# Patient Record
Sex: Male | Born: 1980 | Race: White | Hispanic: No | Marital: Married
Health system: Southern US, Community
[De-identification: ages and names within clinical notes are randomized; demographics above are authoritative.]

---

## 2014-05-07 ENCOUNTER — Other Ambulatory Visit: Payer: Self-pay | Admitting: Neurosurgery

## 2014-05-23 ENCOUNTER — Encounter (HOSPITAL_COMMUNITY): Payer: Self-pay

## 2014-05-23 ENCOUNTER — Encounter (HOSPITAL_COMMUNITY)
Admission: RE | Admit: 2014-05-23 | Discharge: 2014-05-23 | Disposition: A | Discharge status: Home / Self Care | Payer: BC Managed Care – PPO | Source: Ambulatory Visit | Attending: Neurosurgery | Admitting: Neurosurgery

## 2014-05-23 DIAGNOSIS — Z01812 Encounter for preprocedural laboratory examination: Secondary | ICD-10-CM | POA: Diagnosis not present

## 2014-05-23 DIAGNOSIS — I10 Essential (primary) hypertension: Secondary | ICD-10-CM | POA: Diagnosis not present

## 2014-05-23 DIAGNOSIS — Z01810 Encounter for preprocedural cardiovascular examination: Secondary | ICD-10-CM | POA: Insufficient documentation

## 2014-05-23 LAB — BASIC METABOLIC PANEL
Anion gap: 11 (ref 5–15)
BUN: 14 mg/dL (ref 6–20)
CO2: 24 mmol/L (ref 22–32)
CREATININE: 0.89 mg/dL (ref 0.61–1.24)
Calcium: 9.3 mg/dL (ref 8.9–10.3)
Chloride: 104 mmol/L (ref 101–111)
GFR calc Af Amer: >60 mL/min (ref >60)
GLUCOSE: 125 mg/dL — AB (ref 70–99)
Potassium: 4 mmol/L (ref 3.5–5.1)
Sodium: 139 mmol/L (ref 135–145)

## 2014-05-23 LAB — CBC
HCT: 45.1 % (ref 39.0–52.0)
HEMOGLOBIN: 15.6 g/dL (ref 13.0–17.0)
MCH: 29.3 pg (ref 26.0–34.0)
MCHC: 34.6 g/dL (ref 30.0–36.0)
MCV: 84.6 fL (ref 78.0–100.0)
Platelets: 235 10*3/uL (ref 150–400)
RBC: 5.33 MIL/uL (ref 4.22–5.81)
RDW: 12.8 % (ref 11.5–15.5)
WBC: 7.3 10*3/uL (ref 4.0–10.5)

## 2014-05-23 LAB — TYPE AND SCREEN
ABO/RH(D): A POS
Antibody Screen: NEGATIVE

## 2014-05-23 LAB — SURGICAL PCR SCREEN
MRSA, PCR: POSITIVE — AB
Staphylococcus aureus: POSITIVE — AB

## 2014-05-23 LAB — ABO/RH: ABO/RH(D): A POS

## 2014-05-23 NOTE — Pre-Procedure Instructions (Signed)
Catha BrowScott R Abernathy  05/23/2014   Your procedure is scheduled on:  May 31, 2014  [facility]mitting at 8 AM.  Call this number if you have problems the morning of surgery: [phone]   Remember:   Do not eat food or drink liquids after midnight.   Take these medicines the morning of surgery with A SIP OF WATER: gabapentin (NEURONTIN), ranitidine (ZANTAC)   IF NEEDED: HYDROcodone-acetaminophen (NORCO/VICODIN), tiZANidine (ZANAFLEX)   STOP ASPIRIN, NSAIDS (ADVIL, ALEVE, IBUPROFEN) ONE WEEK PRIOR TO SURGERY   Do not wear jewelry.  Do not wear lotions, powders, or colognes. You may wear deodorant.  Men may shave face and neck.  Do not bring valuables to the hospital.  [facility] is not responsible  for any belongings or valuables.               Contacts, dentures or bridgework may not be worn into surgery.  Leave suitcase in the car. After surgery it may be brought to your room.  For patients admitted to the hospital, discharge time is determined by your   treatment team.               Patients discharged the day of surgery will not be allowed to drive  home.  Name and phone number of your driver: family/friend  Special Instructions: "Preaparing for surgery"   Please read over the following fact sheets that you were given: Pain Booklet, Coughing and Deep Breathing, Blood Transfusion Information and Surgical Site Infection Prevention

## 2014-05-23 NOTE — Progress Notes (Signed)
Left voice message at [phone] for pt to pick up mupirocin

## 2014-05-30 MED ORDER — DEXTROSE 5 % IV SOLN
3.0000 g | INTRAVENOUS | Status: AC
  Administered 2014-05-31: 3 g via INTRAVENOUS
  Filled 2014-05-30: qty 3000

## 2014-05-31 ENCOUNTER — Encounter (HOSPITAL_COMMUNITY): Payer: Self-pay

## 2014-05-31 ENCOUNTER — Inpatient Hospital Stay (HOSPITAL_COMMUNITY): Payer: BC Managed Care – PPO | Admitting: Anesthesiology

## 2014-05-31 ENCOUNTER — Inpatient Hospital Stay (HOSPITAL_COMMUNITY): Payer: BC Managed Care – PPO

## 2014-05-31 ENCOUNTER — Inpatient Hospital Stay (HOSPITAL_COMMUNITY)
Admission: RE | Admit: 2014-05-31 | Discharge: 2014-06-01 | DRG: 460 | Disposition: A | Discharge status: Home / Self Care | Payer: BC Managed Care – PPO | Source: Ambulatory Visit | Attending: Neurosurgery | Admitting: Neurosurgery

## 2014-05-31 ENCOUNTER — Encounter (HOSPITAL_COMMUNITY)
Admission: RE | Disposition: A | Discharge status: Home / Self Care | Payer: Self-pay | Source: Ambulatory Visit | Attending: Neurosurgery

## 2014-05-31 DIAGNOSIS — M4806 Spinal stenosis, lumbar region: Secondary | ICD-10-CM | POA: Diagnosis present

## 2014-05-31 DIAGNOSIS — E669 Obesity, unspecified: Secondary | ICD-10-CM | POA: Diagnosis present

## 2014-05-31 DIAGNOSIS — Z419 Encounter for procedure for purposes other than remedying health state, unspecified: Secondary | ICD-10-CM

## 2014-05-31 DIAGNOSIS — R7611 Nonspecific reaction to tuberculin skin test without active tuberculosis: Secondary | ICD-10-CM | POA: Diagnosis present

## 2014-05-31 DIAGNOSIS — Z6839 Body mass index (BMI) 39.0-39.9, adult: Secondary | ICD-10-CM | POA: Diagnosis not present

## 2014-05-31 DIAGNOSIS — Z79891 Long term (current) use of opiate analgesic: Secondary | ICD-10-CM | POA: Diagnosis not present

## 2014-05-31 DIAGNOSIS — M5116 Intervertebral disc disorders with radiculopathy, lumbar region: Secondary | ICD-10-CM | POA: Diagnosis present

## 2014-05-31 DIAGNOSIS — M4316 Spondylolisthesis, lumbar region: Secondary | ICD-10-CM | POA: Diagnosis present

## 2014-05-31 DIAGNOSIS — Z79899 Other long term (current) drug therapy: Secondary | ICD-10-CM

## 2014-05-31 DIAGNOSIS — M4126 Other idiopathic scoliosis, lumbar region: Secondary | ICD-10-CM | POA: Diagnosis present

## 2014-05-31 HISTORY — PX: MAXIMUM ACCESS (MAS)POSTERIOR LUMBAR INTERBODY FUSION (PLIF) 1 LEVEL: SHX6368

## 2014-05-31 SURGERY — FOR MAXIMUM ACCESS (MAS) POSTERIOR LUMBAR INTERBODY FUSION (PLIF) 1 LEVEL
Anesthesia: General | Site: Spine Lumbar

## 2014-05-31 MED ORDER — OXYCODONE HCL 5 MG PO TABS
5.0000 mg | ORAL_TABLET | Freq: Once | ORAL | Status: AC | PRN
  Administered 2014-05-31: 5 mg via ORAL

## 2014-05-31 MED ORDER — METHOCARBAMOL 500 MG PO TABS
500.0000 mg | ORAL_TABLET | Freq: Four times a day (QID) | ORAL | Status: DC | PRN
  Administered 2014-05-31 – 2014-06-01 (×3): 500 mg via ORAL
  Filled 2014-05-31 (×3): qty 1

## 2014-05-31 MED ORDER — LIDOCAINE-EPINEPHRINE 1 %-1:100000 IJ SOLN
INTRAMUSCULAR | Status: DC | PRN
  Administered 2014-05-31: 5 mL

## 2014-05-31 MED ORDER — ARTIFICIAL TEARS OP OINT
TOPICAL_OINTMENT | OPHTHALMIC | Status: DC | PRN
  Administered 2014-05-31: 1 via OPHTHALMIC

## 2014-05-31 MED ORDER — PROPOFOL 10 MG/ML IV BOLUS
INTRAVENOUS | Status: DC | PRN
  Administered 2014-05-31: 250 mg via INTRAVENOUS

## 2014-05-31 MED ORDER — LIDOCAINE HCL (CARDIAC) 20 MG/ML IV SOLN
INTRAVENOUS | Status: DC | PRN
  Administered 2014-05-31: 100 mg via INTRAVENOUS

## 2014-05-31 MED ORDER — PROMETHAZINE HCL 25 MG/ML IJ SOLN
INTRAMUSCULAR | Status: AC
  Filled 2014-05-31: qty 1

## 2014-05-31 MED ORDER — ALUM & MAG HYDROXIDE-SIMETH 200-200-20 MG/5ML PO SUSP: 30.0000 mL | Freq: Four times a day (QID) | ORAL | Status: DC | PRN

## 2014-05-31 MED ORDER — KETOROLAC TROMETHAMINE 30 MG/ML IJ SOLN
INTRAMUSCULAR | Status: AC
  Filled 2014-05-31: qty 1

## 2014-05-31 MED ORDER — GLYCOPYRROLATE 0.2 MG/ML IJ SOLN
INTRAMUSCULAR | Status: AC
  Filled 2014-05-31: qty 5

## 2014-05-31 MED ORDER — DOCUSATE SODIUM 100 MG PO CAPS
100.0000 mg | ORAL_CAPSULE | Freq: Two times a day (BID) | ORAL | Status: DC
  Administered 2014-05-31: 100 mg via ORAL
  Filled 2014-05-31: qty 1

## 2014-05-31 MED ORDER — DEXTROSE 5 % IV SOLN
500.0000 mg | Freq: Four times a day (QID) | INTRAVENOUS | Status: DC | PRN
  Filled 2014-05-31: qty 5

## 2014-05-31 MED ORDER — NEOSTIGMINE METHYLSULFATE 10 MG/10ML IV SOLN
INTRAVENOUS | Status: DC | PRN
  Administered 2014-05-31: 5 mg via INTRAVENOUS

## 2014-05-31 MED ORDER — FAMOTIDINE 10 MG PO TABS
10.0000 mg | ORAL_TABLET | Freq: Every day | ORAL | Status: DC
  Filled 2014-05-31: qty 1

## 2014-05-31 MED ORDER — OXYCODONE HCL 5 MG/5ML PO SOLN: 5.0000 mg | Freq: Once | ORAL | Status: AC | PRN

## 2014-05-31 MED ORDER — ACETAMINOPHEN 325 MG PO TABS: 650.0000 mg | ORAL_TABLET | ORAL | Status: DC | PRN

## 2014-05-31 MED ORDER — PROPOFOL INFUSION 10 MG/ML OPTIME
INTRAVENOUS | Status: DC | PRN
  Administered 2014-05-31: 50 ug/kg/min via INTRAVENOUS

## 2014-05-31 MED ORDER — ZOLPIDEM TARTRATE 5 MG PO TABS: 5.0000 mg | ORAL_TABLET | Freq: Every evening | ORAL | Status: DC | PRN

## 2014-05-31 MED ORDER — ONDANSETRON HCL 4 MG/2ML IJ SOLN
INTRAMUSCULAR | Status: DC | PRN
  Administered 2014-05-31: 4 mg via INTRAVENOUS

## 2014-05-31 MED ORDER — OXYCODONE-ACETAMINOPHEN 5-325 MG PO TABS
1.0000 | ORAL_TABLET | ORAL | Status: DC | PRN
  Administered 2014-05-31: 2 via ORAL
  Administered 2014-05-31: 1 via ORAL
  Administered 2014-06-01 (×2): 2 via ORAL
  Filled 2014-05-31 (×4): qty 2

## 2014-05-31 MED ORDER — HYDROMORPHONE HCL 1 MG/ML IJ SOLN
0.2500 mg | INTRAMUSCULAR | Status: DC | PRN
  Administered 2014-05-31 (×2): 0.5 mg via INTRAVENOUS

## 2014-05-31 MED ORDER — PANTOPRAZOLE SODIUM 40 MG PO TBEC
40.0000 mg | DELAYED_RELEASE_TABLET | Freq: Every day | ORAL | Status: DC
  Administered 2014-05-31: 40 mg via ORAL
  Filled 2014-05-31: qty 1

## 2014-05-31 MED ORDER — ROCURONIUM BROMIDE 100 MG/10ML IV SOLN
INTRAVENOUS | Status: DC | PRN
  Administered 2014-05-31: 50 mg via INTRAVENOUS

## 2014-05-31 MED ORDER — MENTHOL 3 MG MT LOZG: 1.0000 | LOZENGE | OROMUCOSAL | Status: DC | PRN

## 2014-05-31 MED ORDER — HYDROMORPHONE HCL 1 MG/ML IJ SOLN: 0.5000 mg | INTRAMUSCULAR | Status: DC | PRN

## 2014-05-31 MED ORDER — LIDOCAINE HCL (CARDIAC) 20 MG/ML IV SOLN
INTRAVENOUS | Status: AC
Start: 2014-05-31 — End: 2014-05-31
  Filled 2014-05-31: qty 30

## 2014-05-31 MED ORDER — BUPIVACAINE LIPOSOME 1.3 % IJ SUSP
INTRAMUSCULAR | Status: DC | PRN
  Administered 2014-05-31: 20 mL

## 2014-05-31 MED ORDER — PHENYLEPHRINE HCL 10 MG PO TABS: 10.0000 mg | ORAL_TABLET | ORAL | Status: DC | PRN

## 2014-05-31 MED ORDER — ACETAMINOPHEN 650 MG RE SUPP: 650.0000 mg | RECTAL | Status: DC | PRN

## 2014-05-31 MED ORDER — FENTANYL CITRATE (PF) 250 MCG/5ML IJ SOLN
INTRAMUSCULAR | Status: AC
  Filled 2014-05-31: qty 5

## 2014-05-31 MED ORDER — GABAPENTIN 300 MG PO CAPS
300.0000 mg | ORAL_CAPSULE | Freq: Three times a day (TID) | ORAL | Status: DC | PRN
  Filled 2014-05-31: qty 1

## 2014-05-31 MED ORDER — FLEET ENEMA 7-19 GM/118ML RE ENEM
1.0000 | ENEMA | Freq: Once | RECTAL | Status: AC | PRN
  Filled 2014-05-31: qty 1

## 2014-05-31 MED ORDER — NEOSTIGMINE METHYLSULFATE 10 MG/10ML IV SOLN
INTRAVENOUS | Status: AC
  Filled 2014-05-31: qty 1

## 2014-05-31 MED ORDER — HYDROCODONE-ACETAMINOPHEN 5-325 MG PO TABS: 1.0000 | ORAL_TABLET | ORAL | Status: DC | PRN

## 2014-05-31 MED ORDER — CEFAZOLIN SODIUM-DEXTROSE 2-3 GM-% IV SOLR
2.0000 g | Freq: Three times a day (TID) | INTRAVENOUS | Status: AC
  Administered 2014-05-31 – 2014-06-01 (×2): 2 g via INTRAVENOUS
  Filled 2014-05-31 (×2): qty 50

## 2014-05-31 MED ORDER — MIDAZOLAM HCL 2 MG/2ML IJ SOLN
INTRAMUSCULAR | Status: AC
  Filled 2014-05-31: qty 2

## 2014-05-31 MED ORDER — THROMBIN 20000 UNITS EX SOLR
CUTANEOUS | Status: DC | PRN
  Administered 2014-05-31: 13:00:00 via TOPICAL

## 2014-05-31 MED ORDER — PROPOFOL 10 MG/ML IV BOLUS
INTRAVENOUS | Status: AC
  Filled 2014-05-31: qty 20

## 2014-05-31 MED ORDER — PROMETHAZINE HCL 25 MG/ML IJ SOLN
6.2500 mg | INTRAMUSCULAR | Status: DC | PRN
  Administered 2014-05-31: 6.25 mg via INTRAVENOUS

## 2014-05-31 MED ORDER — PANTOPRAZOLE SODIUM 40 MG IV SOLR
40.0000 mg | Freq: Every day | INTRAVENOUS | Status: DC
  Filled 2014-05-31: qty 40

## 2014-05-31 MED ORDER — BUPIVACAINE HCL (PF) 0.5 % IJ SOLN
INTRAMUSCULAR | Status: DC | PRN
  Administered 2014-05-31: 5 mL

## 2014-05-31 MED ORDER — KETOROLAC TROMETHAMINE 30 MG/ML IJ SOLN
30.0000 mg | Freq: Once | INTRAMUSCULAR | Status: AC | PRN
  Administered 2014-05-31: 30 mg via INTRAVENOUS

## 2014-05-31 MED ORDER — SUCCINYLCHOLINE CHLORIDE 20 MG/ML IJ SOLN
INTRAMUSCULAR | Status: DC | PRN
  Administered 2014-05-31: 100 mg via INTRAVENOUS

## 2014-05-31 MED ORDER — OXYCODONE HCL 5 MG PO TABS
ORAL_TABLET | ORAL | Status: AC
  Filled 2014-05-31: qty 1

## 2014-05-31 MED ORDER — LACTATED RINGERS IV SOLN
INTRAVENOUS | Status: DC
  Administered 2014-05-31 (×3): via INTRAVENOUS

## 2014-05-31 MED ORDER — ONDANSETRON HCL 4 MG/2ML IJ SOLN
INTRAMUSCULAR | Status: AC
  Filled 2014-05-31: qty 4

## 2014-05-31 MED ORDER — SUCCINYLCHOLINE CHLORIDE 20 MG/ML IJ SOLN
INTRAMUSCULAR | Status: AC
Start: 2014-05-31 — End: 2014-05-31
  Filled 2014-05-31: qty 2

## 2014-05-31 MED ORDER — FENTANYL CITRATE (PF) 100 MCG/2ML IJ SOLN
INTRAMUSCULAR | Status: DC | PRN
  Administered 2014-05-31 (×2): 50 ug via INTRAVENOUS
  Administered 2014-05-31: 200 ug via INTRAVENOUS
  Administered 2014-05-31 (×2): 50 ug via INTRAVENOUS
  Administered 2014-05-31: 100 ug via INTRAVENOUS

## 2014-05-31 MED ORDER — VANCOMYCIN HCL 10 G IV SOLR
1500.0000 mg | Freq: Once | INTRAVENOUS | Status: AC
  Administered 2014-05-31: 1500 mg via INTRAVENOUS
  Filled 2014-05-31: qty 1500

## 2014-05-31 MED ORDER — PHENOL 1.4 % MT LIQD: 1.0000 | OROMUCOSAL | Status: DC | PRN

## 2014-05-31 MED ORDER — MUPIROCIN 2 % EX OINT: 1.0000 "application " | TOPICAL_OINTMENT | Freq: Two times a day (BID) | CUTANEOUS | Status: DC

## 2014-05-31 MED ORDER — CHLORHEXIDINE GLUCONATE CLOTH 2 % EX PADS
6.0000 | MEDICATED_PAD | Freq: Every day | CUTANEOUS | Status: DC
  Administered 2014-06-01: 6 via TOPICAL

## 2014-05-31 MED ORDER — SODIUM CHLORIDE 0.9 % IV SOLN: 250.0000 mL | INTRAVENOUS | Status: DC

## 2014-05-31 MED ORDER — TIZANIDINE HCL 4 MG PO TABS
4.0000 mg | ORAL_TABLET | Freq: Three times a day (TID) | ORAL | Status: DC | PRN
  Filled 2014-05-31: qty 1

## 2014-05-31 MED ORDER — SODIUM CHLORIDE 0.9 % IJ SOLN: 3.0000 mL | INTRAMUSCULAR | Status: DC | PRN

## 2014-05-31 MED ORDER — 0.9 % SODIUM CHLORIDE (POUR BTL) OPTIME
TOPICAL | Status: DC | PRN
  Administered 2014-05-31: 1000 mL

## 2014-05-31 MED ORDER — SODIUM CHLORIDE 0.9 % IJ SOLN: 3.0000 mL | Freq: Two times a day (BID) | INTRAMUSCULAR | Status: DC

## 2014-05-31 MED ORDER — BISACODYL 10 MG RE SUPP: 10.0000 mg | Freq: Every day | RECTAL | Status: DC | PRN

## 2014-05-31 MED ORDER — BUPIVACAINE LIPOSOME 1.3 % IJ SUSP
20.0000 mL | INTRAMUSCULAR | Status: DC
  Filled 2014-05-31: qty 20

## 2014-05-31 MED ORDER — POLYETHYLENE GLYCOL 3350 17 G PO PACK
17.0000 g | PACK | Freq: Every day | ORAL | Status: DC | PRN
  Filled 2014-05-31: qty 1

## 2014-05-31 MED ORDER — HYDROMORPHONE HCL 1 MG/ML IJ SOLN
INTRAMUSCULAR | Status: AC
  Filled 2014-05-31: qty 1

## 2014-05-31 MED ORDER — KCL IN DEXTROSE-NACL 20-5-0.45 MEQ/L-%-% IV SOLN
INTRAVENOUS | Status: DC
  Filled 2014-05-31 (×3): qty 1000

## 2014-05-31 MED ORDER — GLYCOPYRROLATE 0.2 MG/ML IJ SOLN
INTRAMUSCULAR | Status: DC | PRN
  Administered 2014-05-31: 1 mg via INTRAVENOUS

## 2014-05-31 MED ORDER — ONDANSETRON HCL 4 MG/2ML IJ SOLN: 4.0000 mg | INTRAMUSCULAR | Status: DC | PRN

## 2014-05-31 MED ORDER — GLYCOPYRROLATE 0.2 MG/ML IJ SOLN
INTRAMUSCULAR | Status: AC
  Filled 2014-05-31: qty 4

## 2014-05-31 SURGICAL SUPPLY — 83 items
BENZOIN TINCTURE PRP APPL 2/3 (GAUZE/BANDAGES/DRESSINGS) ×3 IMPLANT
BIT DRILL PLIF MAS DISP 5.5MM (DRILL) ×1 IMPLANT
BLADE CLIPPER SURG (BLADE) IMPLANT
BONE MATRIX OSTEOCEL PRO MED (Bone Implant) ×3 IMPLANT
BUR MATCHSTICK NEURO 3.0 LAGG (BURR) ×3 IMPLANT
BUR ROUND FLUTED 5 RND (BURR) ×2 IMPLANT
BUR ROUND FLUTED 5MM RND (BURR) ×1
CAGE COROENT LG 12X9X23-12 (Cage) ×6 IMPLANT
CANISTER SUCT 3000ML PPV (MISCELLANEOUS) ×3 IMPLANT
CLIP NEUROVISION LG (CLIP) ×3 IMPLANT
CLOSURE WOUND 1/2 X4 (GAUZE/BANDAGES/DRESSINGS) ×1
CONT SPEC 4OZ CLIKSEAL STRL BL (MISCELLANEOUS) ×6 IMPLANT
COVER BACK TABLE 24X17X13 BIG (DRAPES) IMPLANT
COVER BACK TABLE 60X90IN (DRAPES) ×3 IMPLANT
DECANTER SPIKE VIAL GLASS SM (MISCELLANEOUS) ×3 IMPLANT
DERMABOND ADHESIVE PROPEN (GAUZE/BANDAGES/DRESSINGS) ×2
DERMABOND ADVANCED (GAUZE/BANDAGES/DRESSINGS) ×2
DERMABOND ADVANCED .7 DNX12 (GAUZE/BANDAGES/DRESSINGS) ×1 IMPLANT
DERMABOND ADVANCED .7 DNX6 (GAUZE/BANDAGES/DRESSINGS) ×1 IMPLANT
DRAPE C-ARM 42X72 X-RAY (DRAPES) ×6 IMPLANT
DRAPE C-ARMOR (DRAPES) ×3 IMPLANT
DRAPE LAPAROTOMY 100X72X124 (DRAPES) ×3 IMPLANT
DRAPE POUCH INSTRU U-SHP 10X18 (DRAPES) ×3 IMPLANT
DRAPE SURG 17X23 STRL (DRAPES) ×3 IMPLANT
DRILL PLIF MAS DISP 5.5MM (DRILL) ×3
DRSG OPSITE POSTOP 4X6 (GAUZE/BANDAGES/DRESSINGS) ×3 IMPLANT
DRSG TELFA 3X8 NADH (GAUZE/BANDAGES/DRESSINGS) IMPLANT
DURAPREP 26ML APPLICATOR (WOUND CARE) ×3 IMPLANT
ELECT REM PT RETURN 9FT ADLT (ELECTROSURGICAL) ×3
ELECTRODE REM PT RTRN 9FT ADLT (ELECTROSURGICAL) ×1 IMPLANT
EVACUATOR 1/8 PVC DRAIN (DRAIN) ×3 IMPLANT
GAUZE SPONGE 4X4 12PLY STRL (GAUZE/BANDAGES/DRESSINGS) IMPLANT
GAUZE SPONGE 4X4 16PLY XRAY LF (GAUZE/BANDAGES/DRESSINGS) IMPLANT
GLOVE BIO SURGEON STRL SZ7 (GLOVE) ×9 IMPLANT
GLOVE BIO SURGEON STRL SZ8 (GLOVE) ×6 IMPLANT
GLOVE BIOGEL PI IND STRL 7.0 (GLOVE) ×3 IMPLANT
GLOVE BIOGEL PI IND STRL 8 (GLOVE) ×2 IMPLANT
GLOVE BIOGEL PI IND STRL 8.5 (GLOVE) ×2 IMPLANT
GLOVE BIOGEL PI INDICATOR 7.0 (GLOVE) ×6
GLOVE BIOGEL PI INDICATOR 8 (GLOVE) ×4
GLOVE BIOGEL PI INDICATOR 8.5 (GLOVE) ×4
GLOVE ECLIPSE 8.0 STRL XLNG CF (GLOVE) ×6 IMPLANT
GOWN STRL REUS W/ TWL LRG LVL3 (GOWN DISPOSABLE) IMPLANT
GOWN STRL REUS W/ TWL XL LVL3 (GOWN DISPOSABLE) ×5 IMPLANT
GOWN STRL REUS W/TWL 2XL LVL3 (GOWN DISPOSABLE) ×6 IMPLANT
GOWN STRL REUS W/TWL LRG LVL3 (GOWN DISPOSABLE)
GOWN STRL REUS W/TWL XL LVL3 (GOWN DISPOSABLE) ×10
KIT BASIN OR (CUSTOM PROCEDURE TRAY) ×3 IMPLANT
KIT NEEDLE NVM5 EMG ELECT (KITS) ×1 IMPLANT
KIT NEEDLE NVM5 EMG ELECTRODE (KITS) ×2
KIT POSITION SURG JACKSON T1 (MISCELLANEOUS) ×3 IMPLANT
KIT ROOM TURNOVER OR (KITS) ×3 IMPLANT
MILL MEDIUM DISP (BLADE) ×3 IMPLANT
NEEDLE HYPO 21X1.5 SAFETY (NEEDLE) ×3 IMPLANT
NEEDLE HYPO 25X1 1.5 SAFETY (NEEDLE) ×3 IMPLANT
NEEDLE SPNL 18GX3.5 QUINCKE PK (NEEDLE) IMPLANT
NS IRRIG 1000ML POUR BTL (IV SOLUTION) ×3 IMPLANT
PACK LAMINECTOMY NEURO (CUSTOM PROCEDURE TRAY) ×3 IMPLANT
PAD ARMBOARD 7.5X6 YLW CONV (MISCELLANEOUS) ×9 IMPLANT
PATTIES SURGICAL .5 X.5 (GAUZE/BANDAGES/DRESSINGS) IMPLANT
PATTIES SURGICAL .5 X1 (DISPOSABLE) IMPLANT
PATTIES SURGICAL 1X1 (DISPOSABLE) IMPLANT
ROD PREBENT 45MM LUMBAR (Rod) ×6 IMPLANT
SCREW LOCK (Screw) ×8 IMPLANT
SCREW LOCK FXNS SPNE MAS PL (Screw) ×4 IMPLANT
SCREW PLIF MAS 5.5X35 LUMBAR (Screw) ×6 IMPLANT
SCREW SHANKS 5.5X35 (Screw) ×6 IMPLANT
SCREW TULIP 5.5 (Screw) ×6 IMPLANT
SPONGE LAP 4X18 X RAY DECT (DISPOSABLE) IMPLANT
SPONGE SURGIFOAM ABS GEL 100 (HEMOSTASIS) ×3 IMPLANT
STAPLER SKIN PROX WIDE 3.9 (STAPLE) IMPLANT
STRIP CLOSURE SKIN 1/2X4 (GAUZE/BANDAGES/DRESSINGS) ×2 IMPLANT
SUT VIC AB 1 CT1 18XBRD ANBCTR (SUTURE) ×2 IMPLANT
SUT VIC AB 1 CT1 8-18 (SUTURE) ×4
SUT VIC AB 2-0 CT1 18 (SUTURE) ×6 IMPLANT
SUT VIC AB 3-0 SH 8-18 (SUTURE) ×6 IMPLANT
SYR 20ML ECCENTRIC (SYRINGE) ×3 IMPLANT
SYR 5ML LL (SYRINGE) IMPLANT
TOWEL OR 17X24 6PK STRL BLUE (TOWEL DISPOSABLE) ×3 IMPLANT
TOWEL OR 17X26 10 PK STRL BLUE (TOWEL DISPOSABLE) ×3 IMPLANT
TRAP SPECIMEN MUCOUS 40CC (MISCELLANEOUS) ×3 IMPLANT
TRAY FOLEY CATH 14FRSI W/METER (CATHETERS) ×3 IMPLANT
WATER STERILE IRR 1000ML POUR (IV SOLUTION) ×3 IMPLANT

## 2014-05-31 NOTE — Transfer of Care (Signed)
Immediate Anesthesia Transfer of Care Note  Patient: Hector Hodge  Procedure(s) Performed: Procedure(s) with comments: Lumbar Four-Five Maximum access posterior lumbar interbody fusion (N/A) - L4-5 Maximum access posterior lumbar interbody fusion  Patient Location: PACU  Anesthesia Type:General  Level of Consciousness: awake, alert , oriented and patient cooperative  Airway & Oxygen Therapy: Patient Spontanous Breathing and Patient connected to face mask oxygen  Post-op Assessment: Report given to RN, Post -op Vital signs reviewed and stable and Patient moving all extremities  Post vital signs: Reviewed and stable  Last Vitals:  Filed Vitals:   05/31/14 0926  BP: 146/83  Pulse: 100  Temp: 36.2 C  Resp: 20    Complications: No apparent anesthesia complications

## 2014-05-31 NOTE — Evaluation (Signed)
Physical Therapy Evaluation Patient Details Name: Hector BrowScott R Venus MRN: [id number] DOB: 1980/09/13 Today's Date: 05/31/2014   History of Present Illness  pt is a 34 y/o male admitted with LE pain, spondylolisthesis, s/p L45 fusion  Clinical Impression  Pt admitted with/for lumbar fusion surgery.  Pt currently limited functionally due to the problems listed below.  (see problems list.)  Pt will benefit from PT to maximize function and safety to be able to get home safely with available assist of family.     Follow Up Recommendations No PT follow up    Equipment Recommendations  None recommended by PT    Recommendations for Other Services       Precautions / Restrictions Precautions Precautions: Back Required Braces or Orthoses: Spinal Brace Spinal Brace: Lumbar corset;Applied in sitting position      Mobility  Bed Mobility               General bed mobility comments: discussed log roll and coming up via sidelying  Transfers Overall transfer level: Needs assistance   Transfers: Sit to/from Stand Sit to Stand: Supervision            Ambulation/Gait Ambulation/Gait assistance: Supervision Ambulation Distance (Feet): 250 Feet Assistive device: None Gait Pattern/deviations: Step-through pattern   Gait velocity interpretation: at or above normal speed for age/gender General Gait Details: steady and safe  Stairs Stairs: Yes   Stair Management: One rail Right;Alternating pattern;Forwards Number of Stairs: 10 General stair comments: safe with rail  Wheelchair Mobility    Modified Rankin (Stroke Patients Only)       Balance Overall balance assessment: No apparent balance deficits (not formally assessed)                                           Pertinent Vitals/Pain Pain Assessment: Faces Faces Pain Scale: Hurts a little bit Pain Intervention(s): Monitored during session    Home Living Family/patient expects to be discharged  to:: Private residence Living Arrangements: Spouse/significant other Available Help at Discharge: Family (as needed for 1 week) Type of Home: House Home Access: Stairs to enter   [employer] Corporation[employer]trance Stairs-Number of Steps: 1 Home Layout: One level Home Equipment: Walker - 2 wheels      Prior Function Level of Independence: Independent               Hand Dominance        Extremity/Trunk Assessment               Lower Extremity Assessment: Overall WFL for tasks assessed         Communication   Communication: No difficulties  Cognition Arousal/Alertness: Awake/alert Behavior During Therapy: WFL for tasks assessed/performed Overall Cognitive Status: Within Functional Limits for tasks assessed                      General Comments General comments (skin integrity, edema, etc.): Instructed in back care/prec, log roll, lifting restrictions, progression of activity.    Exercises        Assessment/Plan    PT Assessment Patient needs continued PT services  PT Diagnosis     PT Problem List Decreased activity tolerance;Decreased mobility;Decreased knowledge of precautions  PT Treatment Interventions DME instruction;Gait training;Functional mobility training;Therapeutic activities;Patient/family education   PT Goals (Current goals can be found in the Care Plan section) Acute Rehab PT Goals Patient Stated  Goal: independence PT Goal Formulation: With patient Time For Goal Achievement: 06/07/14 Potential to Achieve Goals: Good    Frequency Min 5X/week   Barriers to discharge        Co-evaluation               End of Session Equipment Utilized During Treatment: Back brace Activity Tolerance: Patient tolerated treatment well Patient left: in chair;with call bell/phone within reach;with family/visitor present Nurse Communication: Mobility status         Time: [id number] PT Time Calculation (min) (ACUTE ONLY): 27 min   Charges:   PT  Evaluation $Initial PT Evaluation Tier I: 1 Procedure PT Treatments $Gait Training: 8-22 mins   PT G Codes:        Corday Wyka, Eliseo GumKenneth V 05/31/2014, 6:34 PM 05/31/2014  Freeland BingKen Blue Ruggerio, PT [phone]  (pager)

## 2014-05-31 NOTE — Interval H&P Note (Signed)
History and Physical Interval Note:  05/31/2014 7:11 AM  Hector BrowScott R Hodge  has presented today for surgery, with the diagnosis of Spondylolisthesis, Lumbar region; Disc displacement, Lumbar; Scoliosis, Idiopathic; Spinal stenosis of the lumbar region  The various methods of treatment have been discussed with the patient and family. After consideration of risks, benefits and other options for treatment, the patient has consented to  Procedure(s) with comments: L4-5 Maximum access posterior lumbar interbody fusion (N/A) - L4-5 Maximum access posterior lumbar interbody fusion as a surgical intervention .  The patient's history has been reviewed, patient examined, no change in status, stable for surgery.  I have reviewed the patient's chart and labs.  Questions were answered to the patient's satisfaction.     [name]

## 2014-05-31 NOTE — Brief Op Note (Signed)
05/31/2014  3:08 PM  PATIENT:  Hector Hodge  34 y.o. male  PRE-OPERATIVE DIAGNOSIS:  Spondylolisthesis, Lumbar region; Disc displacement, Lumbar; Scoliosis, Idiopathic; Spinal stenosis of the lumbar region L 45 level  POST-OPERATIVE DIAGNOSIS:  Spondylolisthesis, Lumbar region; Disc displacement, Lumbar; Scoliosis, Idiopathic; Spinal stenosis of the lumbar region L 45 level  PROCEDURE:  Procedure(s) with comments: Lumbar Four-Five Maximum access posterior lumbar interbody fusion (N/A) - L4-5 Maximum access posterior lumbar interbody fusion with PEEK cages, pedicle screw fixation, posterolateral arthrodesis  SURGEON:  Surgeon(s) and Role:    * Maeola HarmanJoseph Valena Ivanov, MD - Primary  PHYSICIAN ASSISTANT:   ASSISTANTS: Poteat, RN   ANESTHESIA:   general  EBL:  Total I/O In: 1200 [I.V.:1200] Out: 400 [Urine:100; Blood:300]  BLOOD ADMINISTERED:none  DRAINS: none   LOCAL MEDICATIONS USED:  MARCAINE    and LIDOCAINE   SPECIMEN:  No Specimen  DISPOSITION OF SPECIMEN:  N/A  COUNTS:  YES  TOURNIQUET:  * No tourniquets in log *  DICTATION: Patient is a 34 year old with spondylosis , stenosis, spondylolisthesis, disc herniation and severe back and right greater than left lower extremity pain at L4/5 levels of the lumbar spine. It was elected to take him to surgery for MASPLIF L 45 level with posterolateral arthrodesis.  Procedure:   Following uncomplicated induction of GETA, and placement of electrodes for neural monitoring, patient was turned into a prone position on the [place]ak[place] tableand using AP  fluoroscopy the area of planned incision was marked, prepped with betadine scrub and Duraprep, then draped. Exposure was performed of facet joint complex at L 45 level and the MAS retractor was placed.5.5 x 35 mm cortical Nuvasive screws were placed at L 4 bilaterally according to standard landmarks using neural monitoring.  A total laminectomy of L 4 was then performed with disarticulation of  facets.  This bone was saved for grafting, combined with Osteocel after being run through bone mill and was placed in bone packing device.  Thorough discectomy was performed bilaterally at L 45 and the endplates were prepared for grafting.  23 x 12 x 12 degree cages were placed in the interspace and positioning was confirmed with AP and lateral fluoroscopy.  10 cc of autograft/Osteocel was packed in the interspace medial to the second cage.   Remaining screws were placed at L 5 and 35 mm rods were placed.   And the screws were locked and torqued.Final Xrays showed well positioned implants and screw fixation. The posterolateral region was packed with remaining 10 cc of autograft on the right of midline. The wounds were irrigated and then closed with 1, 2-0 and 3-0 Vicryl stitches. Sterile occlusive dressing was placed with Dermabond and an occlusive dressing. The patient was then extubated in the operating room and taken to recovery in stable and satisfactory condition having tolerated her operation well. Counts were correct at the end of the case.  PLAN OF CARE: Admit to inpatient   PATIENT DISPOSITION:  PACU - hemodynamically stable.   Delay start of Pharmacological VTE agent (>24hrs) due to surgical blood loss or risk of bleeding: yes

## 2014-05-31 NOTE — Op Note (Signed)
05/31/2014  3:08 PM  PATIENT:  Hector Hodge  34 y.o. male  PRE-OPERATIVE DIAGNOSIS:  Spondylolisthesis, Lumbar region; Disc displacement, Lumbar; Scoliosis, Idiopathic; Spinal stenosis of the lumbar region L 45 level  POST-OPERATIVE DIAGNOSIS:  Spondylolisthesis, Lumbar region; Disc displacement, Lumbar; Scoliosis, Idiopathic; Spinal stenosis of the lumbar region L 45 level  PROCEDURE:  Procedure(s) with comments: Lumbar Four-Five Maximum access posterior lumbar interbody fusion (N/A) - L4-5 Maximum access posterior lumbar interbody fusion with PEEK cages, pedicle screw fixation, posterolateral arthrodesis  SURGEON:  Surgeon(s) and Role:    * Latera Mclin, MD - Primary  PHYSICIAN ASSISTANT:   ASSISTANTS: Poteat, RN   ANESTHESIA:   general  EBL:  Total I/O In: 1200 [I.V.:1200] Out: 400 [Urine:100; Blood:300]  BLOOD ADMINISTERED:none  DRAINS: none   LOCAL MEDICATIONS USED:  MARCAINE    and LIDOCAINE   SPECIMEN:  No Specimen  DISPOSITION OF SPECIMEN:  N/A  COUNTS:  YES  TOURNIQUET:  * No tourniquets in log *  DICTATION: Patient is a 34-year-old with spondylosis , stenosis, spondylolisthesis, disc herniation and severe back and right greater than left lower extremity pain at L4/5 levels of the lumbar spine. It was elected to take him to surgery for MASPLIF L 45 level with posterolateral arthrodesis.  Procedure:   Following uncomplicated induction of GETA, and placement of electrodes for neural monitoring, patient was turned into a prone position on the Jackson tableand using AP  fluoroscopy the area of planned incision was marked, prepped with betadine scrub and Duraprep, then draped. Exposure was performed of facet joint complex at L 45 level and the MAS retractor was placed.5.5 x 35 mm cortical Nuvasive screws were placed at L 4 bilaterally according to standard landmarks using neural monitoring.  A total laminectomy of L 4 was then performed with disarticulation of  facets.  This bone was saved for grafting, combined with Osteocel after being run through bone mill and was placed in bone packing device.  Thorough discectomy was performed bilaterally at L 45 and the endplates were prepared for grafting.  23 x 12 x 12 degree cages were placed in the interspace and positioning was confirmed with AP and lateral fluoroscopy.  10 cc of autograft/Osteocel was packed in the interspace medial to the second cage.   Remaining screws were placed at L 5 and 35 mm rods were placed.   And the screws were locked and torqued.Final Xrays showed well positioned implants and screw fixation. The posterolateral region was packed with remaining 10 cc of autograft on the right of midline. The wounds were irrigated and then closed with 1, 2-0 and 3-0 Vicryl stitches. Sterile occlusive dressing was placed with Dermabond and an occlusive dressing. The patient was then extubated in the operating room and taken to recovery in stable and satisfactory condition having tolerated her operation well. Counts were correct at the end of the case.  PLAN OF CARE: Admit to inpatient   PATIENT DISPOSITION:  PACU - hemodynamically stable.   Delay start of Pharmacological VTE agent (>24hrs) due to surgical blood loss or risk of bleeding: yes  

## 2014-05-31 NOTE — Anesthesia Preprocedure Evaluation (Addendum)
Anesthesia Evaluation  Patient identified by MRN, date of birth, ID band Patient awake    Reviewed: Allergy & Precautions, H&P , NPO status , Patient's Chart, lab work & pertinent test results  Airway Mallampati: II  TM Distance: >3 FB Neck ROM: Full    Dental  (+) Teeth Intact, Dental Advisory Given   Pulmonary neg pulmonary ROS,  breath sounds clear to auscultation        Cardiovascular negative cardio ROS  Rhythm:Regular Rate:Normal     Neuro/Psych negative neurological ROS     GI/Hepatic negative GI ROS, Neg liver ROS,   Endo/Other  Morbid obesity (BMI 40)  Renal/GU negative Renal ROS     Musculoskeletal   Abdominal   Peds  Hematology negative hematology ROS (+)   Anesthesia Other Findings   Reproductive/Obstetrics                           Anesthesia Physical Anesthesia Plan  ASA: III  Anesthesia Plan: General   Post-op Pain Management:    Induction: Intravenous  Airway Management Planned: Oral ETT  Additional Equipment:   Intra-op Plan:   Post-operative Plan: Extubation in OR  Informed Consent: I have reviewed the patients History and Physical, chart, labs and discussed the procedure including the risks, benefits and alternatives for the proposed anesthesia with the patient or authorized representative who has indicated his/her understanding and acceptance.   Dental advisory given and Dental Advisory Given  Plan Discussed with: CRNA and Anesthesiologist  Anesthesia Plan Comments:        Anesthesia Quick Evaluation

## 2014-05-31 NOTE — H&P (Signed)
Patient ID:   [id number] Patient: Hector Hodge  Date of Birth: 01-18-1981 Visit Type: Office Visit   Date: 05/04/2014 02:00 PM Provider: Danae Orleans. Venetia Maxon MD   This 34 year old male presents for back pain and Leg pain.  History of Present Illness: 1.  back pain  2.  Leg pain  Hector Hodge, 34 year old male employed by the [employer] in [place], visits reporting lumbar and right greater than left lower extremity pain 1 year.  He notes increased pain and weakness right lower extremity after walking 30 minutes.  Left lower extremity pain began approximately one month ago.  He recalls no injuries. Injections by Dr. Antony Madura in [place] in August and October 2015 offered short-term relief only.  Injection March 2016 offered no relief.  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 Ibuprofen 800 mg 3 times a day Gabapentin 300 mg 3 times a day Flexeril 10 mg 3 times a day now out Percocet 5/325 now out Oxycodone now out  MRI on [place] patient complains of low back and bilateral lower extremity pain.  He says his right leg is much worse than his left.  His right leg is not bothering him for greater than one year in his left leg is been bothering him for approximately one month.  He describes pain into his mid calf to both feet going to his heels.  He is engaged in physical therapy and says this has not helped him.  He has had injections in the deceased due to him relief.  I obtained lumbar radiographs with flexion-extension views which show mobile spondylolisthesis of L4 and L5 9.5 mm a neutral lateral radiograph increasing to 11 mm on flexion and reducing to 9.5 mm on extension.        PAST MEDICAL/SURGICAL HISTORY   (Detailed) Patient reported no relevant past medical/surgical history.      PAST MEDICAL HISTORY, SURGICAL  HISTORY, FAMILY HISTORY, SOCIAL HISTORY AND REVIEW OF SYSTEMS I have reviewed the patient's past medical, surgical, family and social history as well as the comprehensive review of systems as included on the [facility] history form dated 05/04/2014, which I have signed.  Family History  (Detailed) Relationship Family Member Name Deceased Age at Death Condition Onset Age Cause of Death  Father    Cancer, unknown  N  Father    Hypertension  N  Father    COPD  N  Mother    Hypertension  N  Mother    Stroke  N  Mother    Diabetes mellitus  N  Sister    Spina bifida  N    SOCIAL HISTORY  (Detailed) Tobacco use reviewed. Preferred language is Unknown.   Smoking status: Never smoker.  SMOKING STATUS Use Status Type Smoking Status Usage Per Day Years Used Total Pack Years  no/never  Never smoker             MEDICATIONS(added, continued or stopped this visit): Started Medication Directions Instruction Stopped   cyclobenzaprine 10 mg tablet take 1 tablet by oral route 3 times every day  05/04/2014   gabapentin 300 mg capsule take 1 capsule by oral route 3 times every day     ibuprofen 800 mg tablet take 1 tablet by oral route 3 times every day with food    05/04/2014 Norco 5  mg-325 mg tablet take 1-2 po q6hrs prn pain    05/04/2014 tizanidine 4 mg tablet take 1 up to TID prn spasms       ALLERGIES: Ingredient Reaction Medication Name Comment  NO KNOWN ALLERGIES     No known allergies.   REVIEW OF SYSTEMS System Neg/Pos Details  Constitutional Negative Chills, fatigue, fever, malaise, night sweats, weight gain and weight loss.  ENMT Negative Ear drainage, hearing loss, nasal drainage, otalgia, sinus pressure and sore throat.  Eyes Negative Eye discharge, eye pain and vision changes.  Respiratory Negative Chronic cough, cough, dyspnea, known TB exposure and wheezing.  Cardio Negative Chest pain, claudication, edema and irregular  heartbeat/palpitations.  GI Negative Abdominal pain, blood in stool, change in stool pattern, constipation, decreased appetite, diarrhea, heartburn, nausea and vomiting.  GU Negative Dribbling, dysuria, erectile dysfunction, hematuria, polyuria, slow stream, urinary frequency, urinary incontinence and urinary retention.  Endocrine Negative Cold intolerance, heat intolerance, polydipsia and polyphagia.  Neuro Positive Extremity weakness, Numbness in extremity.  Psych Negative Anxiety, depression and insomnia.  Integumentary Negative Brittle hair, brittle nails, change in shape/size of mole(s), hair loss, hirsutism, hives, pruritus, rash and skin lesion.  MS Positive Back pain, RLE>LLE pain.  Hema/Lymph Negative Easy bleeding, easy bruising and lymphadenopathy.  Allergic/Immuno Negative Contact allergy, environmental allergies, food allergies and seasonal allergies.  Reproductive Negative Penile discharge and sexual dysfunction.     Vitals Date Temp F BP Pulse Ht In Wt Lb BMI BSA Pain Score  05/04/2014  131/84 98 74 309 39.67  6/10     PHYSICAL EXAM General Level of Distress: no acute distress Overall Appearance: obese    Cardiovascular Cardiac: regular rate and rhythm without murmur  Respiratory Lungs: clear to auscultation  Neurological Recent and Remote Memory: normal Attention Span and Concentration:   normal Language: normal Fund of Knowledge: normal  Right Left Sensation: normal normal Upper Extremity Coordination: normal normal  Lower Extremity Coordination: normal normal  Musculoskeletal Gait and Station: normal  Right Left Upper Extremity Muscle Strength: normal normal Lower Extremity Muscle Strength: normal normal Upper Extremity Muscle Tone:  normal normal Lower Extremity Muscle Tone: normal normal  Motor Strength Upper and lower extremity motor strength was tested in the clinically pertinent muscles.     Deep Tendon  Reflexes  Right Left Biceps: normal normal Triceps: normal normal Brachiloradialis: normal normal Patellar: normal normal Achilles: normal normal  Sensory Sensation was tested at L1 to S1.   Cranial Nerves II. Optic Nerve/Visual Fields: normal III. Oculomotor: normal IV. Trochlear: normal V. Trigeminal: normal VI. Abducens: normal VII. Facial: normal VIII. Acoustic/Vestibular: normal IX. Glossopharyngeal: normal X. Vagus: normal XI. Spinal Accessory: normal XII. Hypoglossal: normal  Motor and other Tests Lhermittes: negative Rhomberg: negative    Right Left Hoffman's: normal normal Clonus: normal normal Babinski: normal normal SLR: positive at 30 degrees positive at 45 degrees Patrick's Pearlean Brownie(Faber): negative negative Toe Walk: normal normal Toe Lift: normal normal Heel Walk: normal normal SI Joint: nontender nontender   Additional Findings:  Patient has pain to the lumbosacral junction with palpation.  He is limited in bending and is able then to the level of his knees with his upper extremities outstretched.  He is able to stones heels and toes.   DIAGNOSTIC RESULTS Lumbar MRI demonstrates spondylosis and laterally based foraminal stenosis affecting the L4 nerve root at the L4 L5 level right side greater than left.  At this level there is minimal listhesis on MRI with him laying supine but clearly  this increases significantly with imaging upright and with bending.  This is based on his mobile listhesis at L4 L5 on flexion and extension views.    IMPRESSION Mobile spondylolisthesis L4 on L5 with bilateral lower extremity radiculopathy right greater than left due to L4 nerve root compression within the foramen.  Completed Orders (this encounter) Order Details Reason Side Interpretation Result Initial Treatment Date Region  Lifestyle education regarding diet Encouraged to eat a well balanced diet and follow up with primary care physician.        Lumbar Spine-  AP/Lat/Flex/Ex      05/04/2014    Assessment/Plan # Detail Type Description   1. Assessment Disc displacement, lumbar (M51.26).       2. Assessment Low back pain, unspecified back pain laterality, with sciatica presence unspecified (M54.5).       3. Assessment Radiculopathy, lumbar region (M54.16).       4. Assessment Scoliosis (and kyphoscoliosis), idiopathic (M41.20).       5. Assessment Spondylolisthesis, lumbar region (M43.16).       6. Assessment Spinal stenosis of lumbar region (M48.06).       7. Assessment Body mass index (BMI) 39.0-39.9, adult (Z68.39).   Plan Orders Today's instructions / counseling include(s) Lifestyle education regarding diet.         Pain Assessment/Treatment Pain Scale: 6/10. Method: Numeric Pain Intensity Scale. Location: back/legs. Onset: 05/03/2013. Duration: varies. Quality: discomforting. Pain Assessment/Treatment follow-up plan of care: Patient is taking medications as prescribed.Marland Kitchen.  MAS PLIF L4 L5 level.  Risks and benefits were discussed in detail and patient and he wishes to proceed.  He understands the importance of weight control and physical conditioning.  Currently he is not able to be physically active because of the severity of his pain.  He and his wife and I discussed the need for weight loss.  Orders: Diagnostic Procedures: Assessment Procedure  M43.16 MAS PLIF - L4-L5  Z68.39 Lumbar Spine- AP/Lat/Flex/Ex  O13.08Z68.39 Lumbar Spine- AP/Lat/Flex/Ex  Instruction(s)/Education: Assessment Instruction  Z68.39 Lifestyle education regarding diet    MEDICATIONS PRESCRIBED TODAY    Rx Quantity Refills  TIZANIDINE HCL 4 mg  60 0  NORCO 5 mg-325 mg  60 0            Provider:  Danae OrleansJoseph D. Venetia MaxonStern MD  05/04/2014 03:23 PM Dictation edited by: Danae OrleansJoseph D. Venetia MaxonStern    CC Providers: Collene LeydenJames Dailey [address] [place], [place] [phone]-              Electronically signed by Danae OrleansJoseph D. Venetia MaxonStern MD on 05/05/2014 04:36 PM

## 2014-05-31 NOTE — Progress Notes (Signed)
Awake, alert, conversant.  MAEW with good strength.  Doing well. 

## 2014-05-31 NOTE — Anesthesia Procedure Notes (Signed)
Procedure Name: Intubation Date/Time: 05/31/2014 11:48 AM Performed by: Carmela RimaMARTINELLI, Selda Jalbert F Pre-anesthesia Checklist: Patient being monitored, Suction available, Emergency Drugs available, Patient identified and Timeout performed Patient Re-evaluated:Patient Re-evaluated prior to inductionOxygen Delivery Method: Circle system utilized Preoxygenation: Pre-oxygenation with 100% oxygen Intubation Type: IV induction and Rapid sequence Ventilation: Mask ventilation without difficulty Laryngoscope Size: Mac and 4 Grade View: Grade I Tube type: Oral Tube size: 7.5 mm Number of attempts: 1 Placement Confirmation: positive ETCO2,  ETT inserted through vocal cords under direct vision and breath sounds checked- equal and bilateral Secured at: 24 cm Tube secured with: Tape Dental Injury: Teeth and Oropharynx as per pre-operative assessment

## 2014-06-01 NOTE — Progress Notes (Signed)
Subjective: Patient reports "I feel good!  I just hurt when i get out of bed or out of the chair, but then it eases off"  Objective: Vital signs in last 24 hours: Temp:  [97 F (36.1 C)-98.4 F (36.9 C)] 98.2 F (36.8 C) (05/13 0400) Pulse Rate:  [80-105] 80 (05/13 0400) Resp:  [12-22] 20 (05/13 0400) BP: (90-151)/(41-90) 111/67 mmHg (05/13 0451) SpO2:  [96 %-100 %] 99 % (05/13 0400) Weight:  [139.708 kg (308 lb)] 139.708 kg (308 lb) (05/12 0926)  Intake/Output from previous day: 05/12 0701 - 05/13 0700 In: 2450 [P.O.:1250; I.V.:1200] Out: 1500 [Urine:1200; Blood:300] Intake/Output this shift:    Alert, conversant. Good strength BLE. Incision flat, without erythema, swelling, or drainage beneath honeycomb drsg. Ambulated in hallway in LSO without difficulty.  Lab Results: No results for input(s): WBC, HGB, HCT, PLT in the last 72 hours. BMET No results for input(s): NA, K, CL, CO2, GLUCOSE, BUN, CREATININE, CALCIUM in the last 72 hours.  Studies/Results: Dg Lumbar Spine 2-3 Views  05/31/2014   CLINICAL DATA:  Spondylolisthesis.  Spinal stenosis.  EXAM: LUMBAR SPINE - 2-3 VIEW; DG C-ARM 61-120 MIN  COMPARISON:  MRI dated 08/16/2013 and radiographs dated 05/04/2014  FINDINGS: AP and lateral C-arm images demonstrate the patient undergoing interbody and posterior fusion at L4-5. Spondylolisthesis has been improved.  IMPRESSION: Fusion performed at L4-5.   Electronically Signed   By: Francene BoyersJames  Maxwell M.D.   On: 05/31/2014 15:18   Dg C-arm 1-60 Min  05/31/2014   CLINICAL DATA:  Spondylolisthesis.  Spinal stenosis.  EXAM: LUMBAR SPINE - 2-3 VIEW; DG C-ARM 61-120 MIN  COMPARISON:  MRI dated 08/16/2013 and radiographs dated 05/04/2014  FINDINGS: AP and lateral C-arm images demonstrate the patient undergoing interbody and posterior fusion at L4-5. Spondylolisthesis has been improved.  IMPRESSION: Fusion performed at L4-5.   Electronically Signed   By: Francene BoyersJames  Maxwell M.D.   On: 05/31/2014 15:18     Assessment/Plan: Improving    LOS: 1 day  Per DrStern, d/c IV, d/c to home. Rx's to chart for Percocet 10/325 & Robaxin 500mg  for home use. Pt has f/u appt for Jun 6. Pt & wife verballize understanding of d/c instructions.   Hector Hodge, Hector Hodge 06/01/2014, 8:10 AM

## 2014-06-01 NOTE — Progress Notes (Addendum)
Physical Therapy Treatment Patient Details Name: Hector Hodge MRN: [id number] DOB: 17-Feb-1980 Today's Date: 06/01/2014    History of Present Illness pt is a 34 y/o male admitted with LE pain, spondylolisthesis, s/p L45 fusion    PT Comments    Pt is progressing well with mobility. He is independent with transfers and with ambulation. He verbalizes understanding of back precautions. Instructed pt in isometric abdominal strengthening exercises. Encouraged frequent ambulation at home. From PT standpoint he is ready to DC home.    Follow Up Recommendations  No PT follow up     Equipment Recommendations  None recommended by PT    Recommendations for Other Services       Precautions / Restrictions Precautions Precautions: Back Precaution Comments: pt verbalizes understanding of back precautions Required Braces or Orthoses: Spinal Brace Spinal Brace: Lumbar corset;Applied in sitting position Restrictions Weight Bearing Restrictions: No    Mobility  Bed Mobility               General bed mobility comments: reviewed technique for  log roll and coming up via sidelying  Pt was up in chair at start of tx, stated he needed min A for initiating roll.   Transfers Overall transfer level: Modified independent Equipment used: None Transfers: Sit to/from Stand Sit to Stand: Modified independent (Device/Increase time)         General transfer comment: heavy use of armrests, increased time due to pain  Ambulation/Gait Ambulation/Gait assistance: Independent Ambulation Distance (Feet): 800 Feet Assistive device: None Gait Pattern/deviations: WFL(Within Functional Limits)   Gait velocity interpretation: at or above normal speed for age/gender General Gait Details: steady and safe   Stairs            Wheelchair Mobility    Modified Rankin (Stroke Patients Only)       Balance Overall balance assessment: No apparent balance deficits (not formally assessed)                                   Cognition Arousal/Alertness: Awake/alert Behavior During Therapy: WFL for tasks assessed/performed Overall Cognitive Status: Within Functional Limits for tasks assessed                      Exercises  Instructed in isometric transversus abdominus contractions.    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, return to work as prison guard PT Goal Formulation: With patient Time For Goal Achievement: 06/07/14 Potential to Achieve Goals: Good Progress towards PT goals: Goals met/education completed, patient discharged from PT    Frequency  Min 5X/week    PT Plan Current plan remains appropriate    Co-evaluation             End of Session Equipment Utilized During Treatment: Back brace Activity Tolerance: Patient tolerated treatment well Patient left: in chair;with call bell/phone within reach;with family/visitor present  Time: 8242-9980 PT Time Calculation (min) (ACUTE ONLY): 27 min  Charges:  $Gait Training: 8-22 mins $Therapeutic Activity: 8-22 mins                    G Codes:      Philomena Doheny 06/01/2014, 9:37 AM [phone]

## 2014-06-01 NOTE — Progress Notes (Signed)
Pt doing well. Pt and wife given D/C instructions with Rx's, verbal understanding was provided. Pt's IV was removed prior to D/C. Pt's incision is covered with Honeycomb dressing and has no sign of infection. Pt D/C'd home via wheelchair with wife per MD order. Pt is stable @ D/C and has no other needs at this time. Rema FendtAshley Smantha Boakye, RN

## 2014-06-01 NOTE — Progress Notes (Signed)
Occupational Therapy Evaluation Patient Details Name: Hector Hodge MRN: [id number] DOB: [birth year] Today's Date: 06/01/2014    History of Present Illness pt is a 34 y/o male admitted with LE pain, spondylolisthesis, s/p L45 fusion   Clinical Impression   Completed education regarding compensatory techniques, use of AE and available DME for back precautions/ADL. Pt/wife verbalized understanding. Written information given.    Follow Up Recommendations  No OT follow up;Supervision - Intermittent    Equipment Recommendations  None recommended by OT    Recommendations for Other Services       Precautions / Restrictions Precautions Precautions: Back Precaution Booklet Issued: Yes (comment) Precaution Comments: pt verbalizes understanding of back precautions Required Braces or Orthoses: Spinal Brace Spinal Brace: Lumbar corset;Applied in sitting position Restrictions Weight Bearing Restrictions: No      Mobility Bed Mobility Overal bed mobility: Modified Independent             General bed mobility comments: reviewed technique for  log roll and coming up via sidelying  Transfers Overall transfer level: Modified independent Equipment used: None Transfers: Sit to/from Stand Sit to Stand: Modified independent (Device/Increase time)             Balance Overall balance assessment: No apparent balance deficits (not formally assessed)                                          ADL Overall ADL's : Needs assistance/impaired                                     Functional mobility during ADLs: Modified independent General ADL Comments: completed education regarding compensatory techniques, use of AE/DME to adhere to back precautions. wife present for education and verbalized understanding. discussed home safety and reducing risk of falls. Discussed proper set up of ADL and IADL tasks to facilitate safety and independence when wife  returns to work.                     Pertinent Vitals/Pain Pain Assessment: 0-10 Pain Score: 5  Pain Location: bacl Pain Descriptors / Indicators: Aching Pain Intervention(s): Limited activity within patient's tolerance     Hand Dominance     Extremity/Trunk Assessment Upper Extremity Assessment Upper Extremity Assessment: Overall WFL for tasks assessed   Lower Extremity Assessment Lower Extremity Assessment: Overall WFL for tasks assessed   Cervical / Trunk Assessment Cervical / Trunk Assessment: Normal   Communication     Cognition Arousal/Alertness: Awake/alert Behavior During Therapy: WFL for tasks assessed/performed Overall Cognitive Status: Within Functional Limits for tasks assessed                                        Home Living Family/patient expects to be discharged to:: Private residence Living Arrangements: Spouse/significant other Available Help at Discharge: Family Type of Home: House Home Access: Stairs to enter Secretary/administratorntrance Stairs-Number of Steps: 1   Home Layout: One level     Bathroom Shower/Tub: Tub/shower unit Shower/tub characteristics: Engineer, building servicesCurtain Bathroom Toilet: Standard Bathroom Accessibility: Yes How Accessible: Accessible via walker Home Equipment: Walker - 2 wheels          Prior Functioning/Environment Level of Independence: Independent  OT Diagnosis: Acute pain   OT Problem List: Decreased knowledge of use of DME or AE;Decreased knowledge of precautions;Obesity;Pain   OT Treatment/Interventions:      OT Goals(Current goals can be found in the care plan section) Acute Rehab OT Goals 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, return to work as prison guard OT Goal Formulation: All assessment and education complete, DC therapy  OT Frequency:     Barriers to D/C:            Co-evaluation              End of Session Equipment Utilized During Treatment: Back brace Nurse Communication:  Mobility status  Activity Tolerance: Patient tolerated treatment well Patient left: Other (comment) (walking in room)   Time: [id number] OT Time Calculation (min): 13 min Charges:  OT General Charges $OT Visit: 1 Procedure OT Evaluation $Initial OT Evaluation Tier I: 1 Procedure G-Codes:    Storie Heffern,HILLARY 06/01/2014, 12:54 PM   Luisa DagoHilary Read Bonelli, OTR/L  [phone] 06/01/2014

## 2014-06-01 NOTE — Discharge Summary (Signed)
Physician Discharge Summary  Patient ID: Hector BrowScott R Hodge MRN: [id number] DOB/AGE: [birth year] 33 y.o.  Admit date: 05/31/2014 Discharge date: 06/01/2014  Admission Diagnoses: Spondylolisthesis, Lumbar region; Disc displacement, Lumbar; Scoliosis, Idiopathic; Spinal stenosis of the lumbar region L 45 level   Discharge Diagnoses: Spondylolisthesis, Lumbar region; Disc displacement, Lumbar; Scoliosis, Idiopathic; Spinal stenosis of the lumbar region L 45 level s/p Lumbar Four-Five Maximum access posterior lumbar interbody fusion (N/A) - L4-5 Maximum access posterior lumbar interbody fusion with PEEK cages, pedicle screw fixation, posterolateral arthrodesis  Active Problems:   Spondylolisthesis of lumbar region   Discharged Condition: good  Hospital Course: Hector SandersScott Hodge was admitted for surgery with dx spondylolisthesis, scoliosis, and radiculopathy. Following uncomplicated MAS PLIF L4-5, he recovered well and transferred to 3500 for nursing care and therapies. He has progressed nicely.  Consults: None  Significant Diagnostic Studies: radiology: X-Ray: intra-operative  Treatments: surgery: Lumbar Four-Five Maximum access posterior lumbar interbody fusion (N/A) - L4-5 Maximum access posterior lumbar interbody fusion with PEEK cages, pedicle screw fixation, posterolateral arthrodesis   Discharge Exam: Blood pressure 111/67, pulse 80, temperature 98.2 F (36.8 C), temperature source Oral, resp. rate 20, weight 139.708 kg (308 lb), SpO2 99 %. Alert, conversant. Good strength BLE. Incision flat, without erythema, swelling, or drainage beneath honeycomb drsg. Ambulated in hallway in LSO without difficulty.   Disposition: Discharge to home. Rx's to chart for Percocet 10/325 & Robaxin 500mg  for home use. Pt has f/u appt for Jun 6. Pt & wife verballize understanding of d/c instructions. He will stop Ibuprofen.      Medication List    ASK your doctor about these medications        acetaminophen 325 MG tablet  Commonly known as:  TYLENOL  Take 650 mg by mouth every 6 (six) hours as needed.     gabapentin 300 MG capsule  Commonly known as:  NEURONTIN  Take 300 mg by mouth 3 (three) times daily as needed (pain).     HYDROcodone-acetaminophen 5-325 MG per tablet  Commonly known as:  NORCO/VICODIN  Take 1 tablet by mouth every 4 (four) hours as needed (pain).     ibuprofen 800 MG tablet  Commonly known as:  ADVIL,MOTRIN  Take 800 mg by mouth 3 (three) times daily as needed.     phenylephrine 10 MG Tabs tablet  Commonly known as:  SUDAFED PE  Take 10 mg by mouth every 4 (four) hours as needed.     ranitidine 150 MG tablet  Commonly known as:  ZANTAC  Take 150 mg by mouth daily.     tiZANidine 4 MG tablet  Commonly known as:  ZANAFLEX  Take 4 mg by mouth 3 (three) times daily as needed for muscle spasms.         Signed: Georgiann Cockeroteat, Ardyth Kelso 06/01/2014, 8:14 AM

## 2014-06-04 NOTE — Anesthesia Postprocedure Evaluation (Signed)
  Anesthesia Post-op Note  Patient: Hector Hodge  Procedure(s) Performed: Procedure(s) with comments: Lumbar Four-Five Maximum access posterior lumbar interbody fusion (N/A) - L4-5 Maximum access posterior lumbar interbody fusion  Patient Location: PACU  Anesthesia Type:General  Level of Consciousness: awake and alert   Airway and Oxygen Therapy: Patient Spontanous Breathing  Post-op Pain: mild  Post-op Assessment: Post-op Vital signs reviewed  Post-op Vital Signs: Reviewed  Last Vitals:  Filed Vitals:   06/01/14 0810  BP: 119/74  Pulse: 90  Temp: 36.9 C  Resp: 18    Complications: No apparent anesthesia complications

## 2014-06-07 ENCOUNTER — Encounter (HOSPITAL_COMMUNITY): Payer: Self-pay | Admitting: Neurosurgery

## 2015-07-12 IMAGING — RF DG C-ARM 61-120 MIN
1 series · 2 of 2 positions shown · non-contrast
Comparison: MRI dated 08/16/2013 and radiographs dated 05/04/2014

CLINICAL DATA: Spondylolisthesis.  Spinal stenosis.

EXAM:
LUMBAR SPINE - 2-3 VIEW; DG C-ARM 61-120 MIN

[Series 1: run · 2 of 2 slices shown]
[im 1/2]
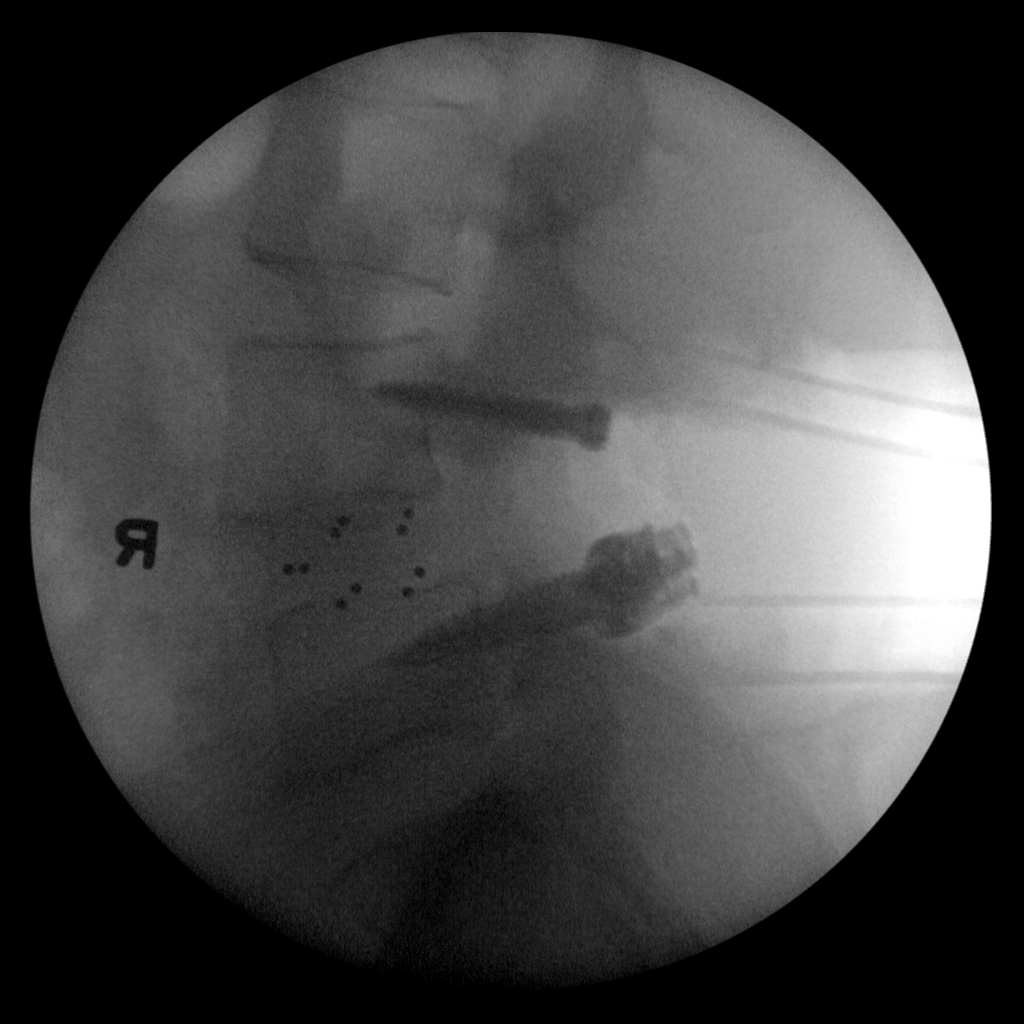
[im 2/2]
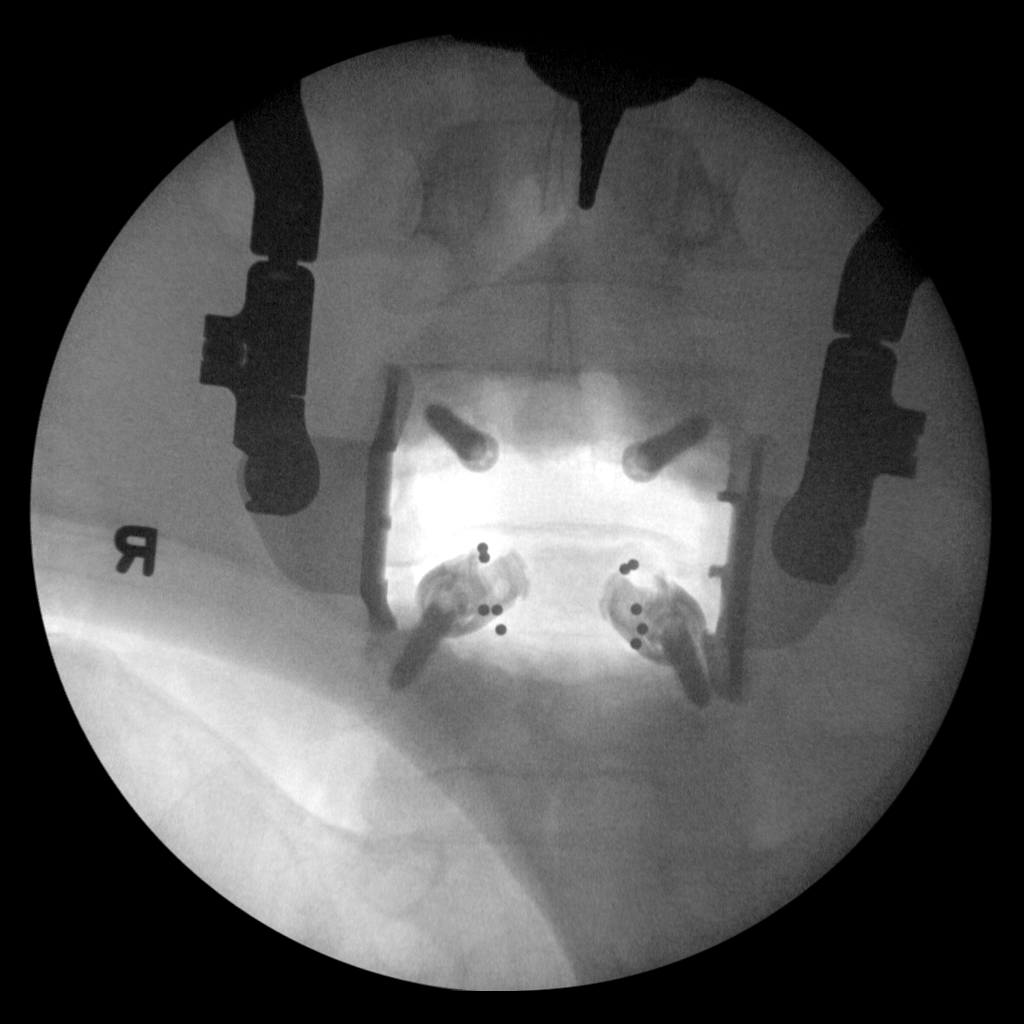

[2 of 2 positions shown; findings below may reference images not displayed]

FINDINGS: AP and lateral C-arm images demonstrate the patient undergoing
interbody and posterior fusion at L4-5. Spondylolisthesis has been
improved.
IMPRESSION: Fusion performed at L4-5.
